# Patient Record
Sex: Male | Born: 1965 | Race: White | Hispanic: No | Smoking: Never smoker
Health system: Southern US, Community
[De-identification: ages and names within clinical notes are randomized; demographics above are authoritative.]

## PROBLEM LIST (undated history)

## (undated) DIAGNOSIS — H409 Unspecified glaucoma: Secondary | ICD-10-CM

## (undated) DIAGNOSIS — N200 Calculus of kidney: Secondary | ICD-10-CM

## (undated) HISTORY — PX: EYE SURGERY: SHX253

---

## 2010-07-19 ENCOUNTER — Ambulatory Visit: Payer: Self-pay | Admitting: Diagnostic Radiology

## 2010-07-19 ENCOUNTER — Emergency Department (HOSPITAL_BASED_OUTPATIENT_CLINIC_OR_DEPARTMENT_OTHER): Admission: EM | Admit: 2010-07-19 | Discharge: 2010-07-19 | Payer: Self-pay | Admitting: Emergency Medicine

## 2010-11-18 LAB — COMPREHENSIVE METABOLIC PANEL
Alkaline Phosphatase: 66 U/L (ref 39–117)
BUN: 18 mg/dL (ref 6–23)
Chloride: 107 mEq/L (ref 96–112)
Creatinine, Ser: 1 mg/dL (ref 0.4–1.5)
Glucose, Bld: 135 mg/dL — ABNORMAL HIGH (ref 70–99)
Potassium: 4.4 mEq/L (ref 3.5–5.1)
Total Bilirubin: 0.9 mg/dL (ref 0.3–1.2)

## 2010-11-18 LAB — URINALYSIS, ROUTINE W REFLEX MICROSCOPIC
Leukocytes, UA: NEGATIVE
Nitrite: NEGATIVE
Protein, ur: NEGATIVE mg/dL
Urobilinogen, UA: 0.2 mg/dL (ref 0.0–1.0)

## 2010-11-18 LAB — CBC
HCT: 41.8 % (ref 39.0–52.0)
MCH: 33.5 pg (ref 26.0–34.0)
MCV: 95.4 fL (ref 78.0–100.0)
RBC: 4.39 MIL/uL (ref 4.22–5.81)
RDW: 11.7 % (ref 11.5–15.5)
WBC: 11.1 10*3/uL — ABNORMAL HIGH (ref 4.0–10.5)

## 2010-11-18 LAB — URINE MICROSCOPIC-ADD ON

## 2010-11-18 LAB — DIFFERENTIAL
Basophils Absolute: 0.1 10*3/uL (ref 0.0–0.1)
Basophils Relative: 1 % (ref 0–1)
Lymphocytes Relative: 11 % — ABNORMAL LOW (ref 12–46)
Neutro Abs: 9.2 10*3/uL — ABNORMAL HIGH (ref 1.7–7.7)
Neutrophils Relative %: 83 % — ABNORMAL HIGH (ref 43–77)

## 2010-11-18 LAB — LIPASE, BLOOD: Lipase: 103 U/L (ref 23–300)

## 2011-06-13 IMAGING — CT CT ABD-PELV W/O CM
2 of 4 series · 16 of 46 positions shown, 18 images · non-contrast
Comparison: None.

CLINICAL DATA: Right flank pain radiating to right inguinal area.

CT ABDOMEN AND PELVIS WITHOUT CONTRAST
TECHNIQUE: Multidetector CT imaging of the abdomen and pelvis was
performed following the standard protocol without intravenous
contrast.

[Series 2: renal stone < 200 lbs 5.0 b31f · axial · 0.75mm/px · z∈[+661,+1066]mm · 13 of 89 slices shown, 15 images]
[im 4/89  soft-tissue]
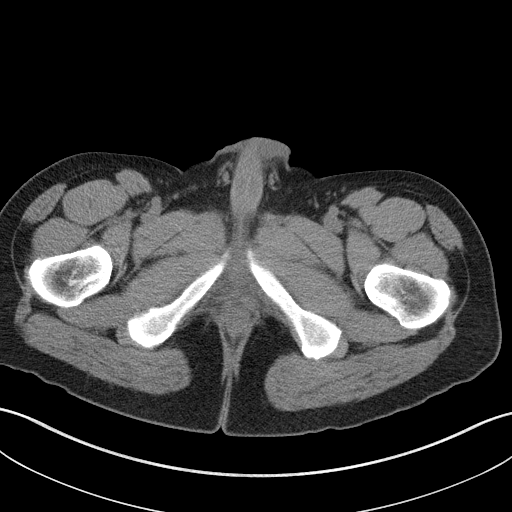
[im 4/89  bone]
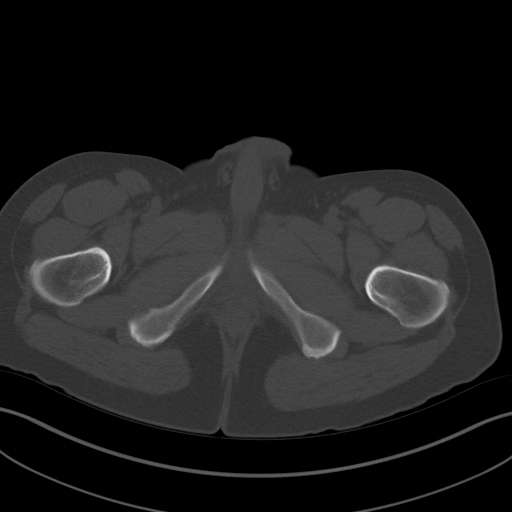
[im 11/89  soft-tissue]
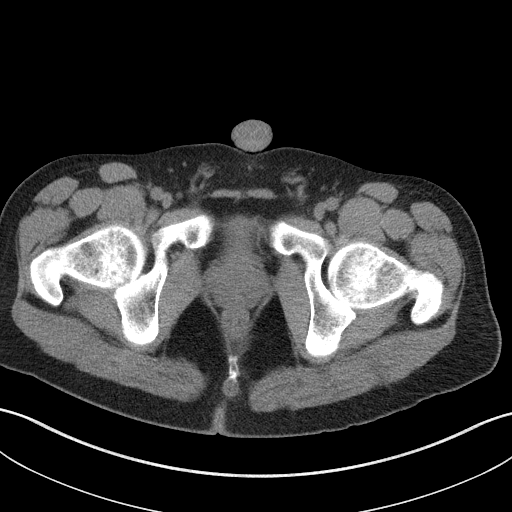
[im 18/89  soft-tissue]
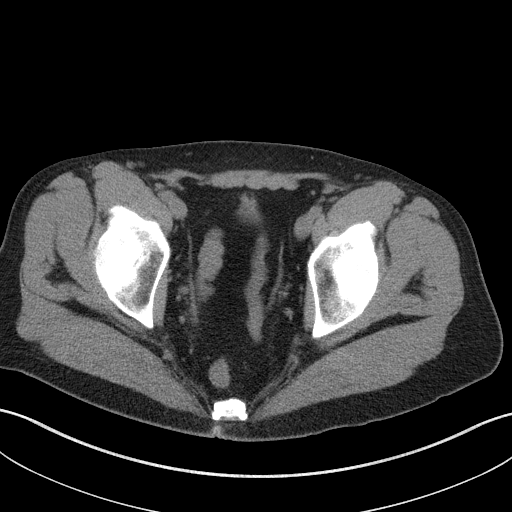
[im 25/89  soft-tissue]
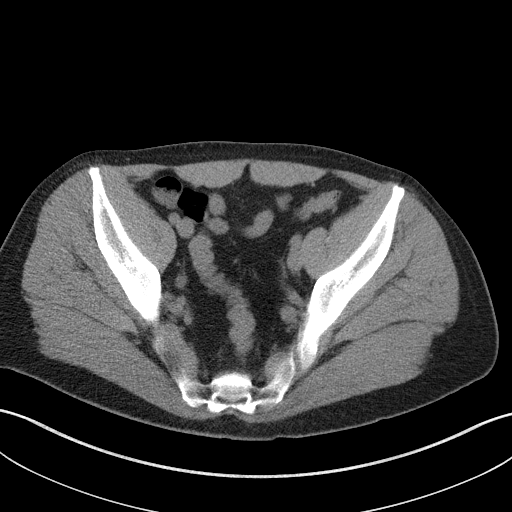
[im 32/89  soft-tissue]
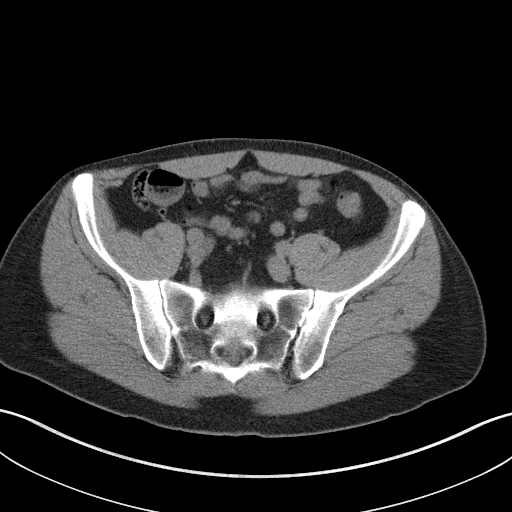
[im 39/89  soft-tissue]
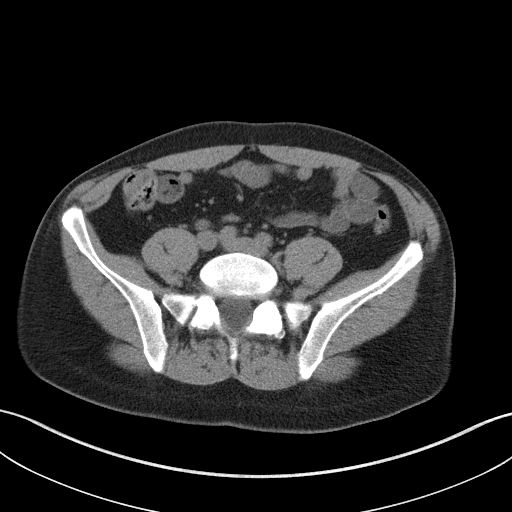
[im 46/89  soft-tissue]
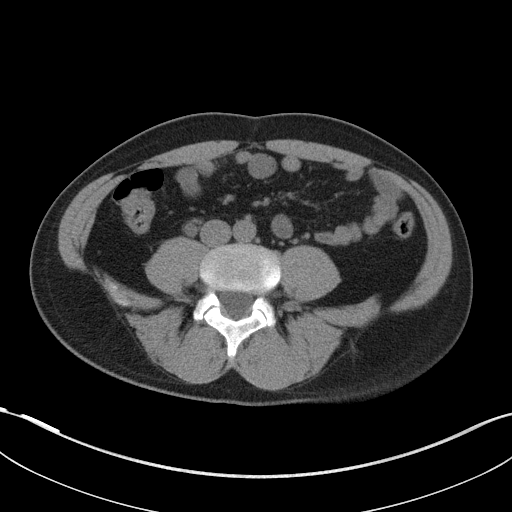
[im 50/89  soft-tissue]
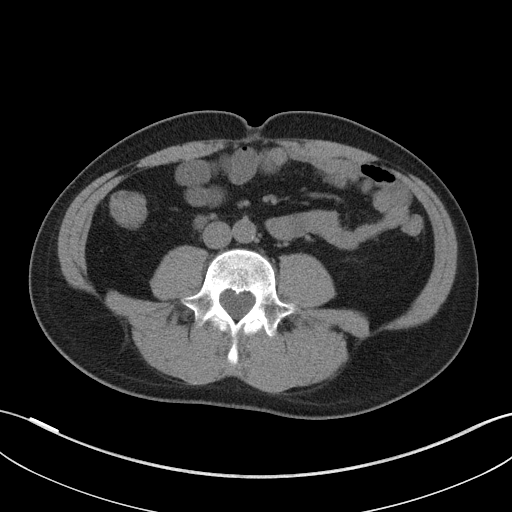
[im 57/89  soft-tissue]
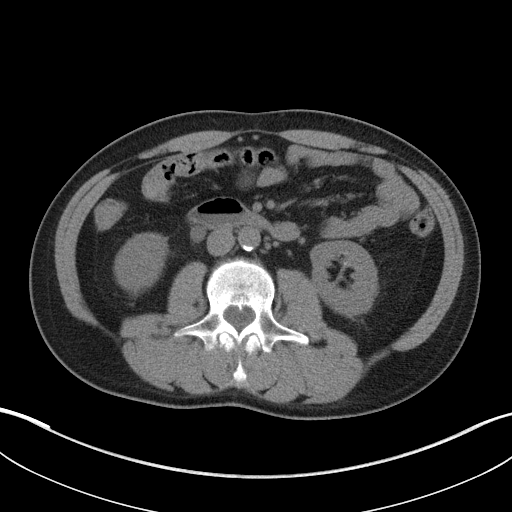
[im 57/89  bone]
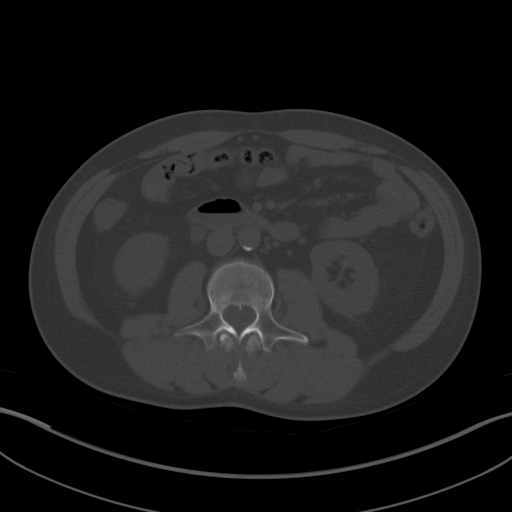
[im 64/89  soft-tissue]
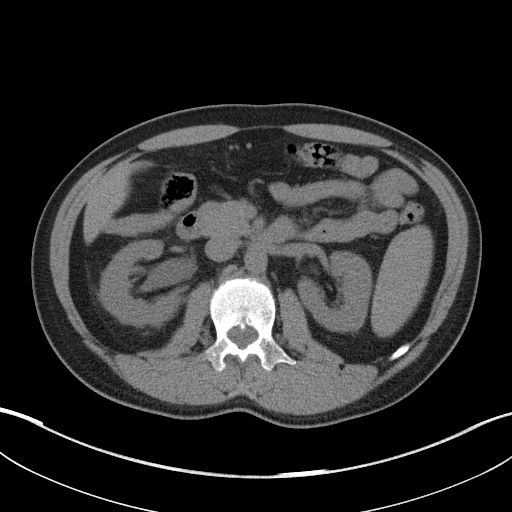
[im 71/89  soft-tissue]
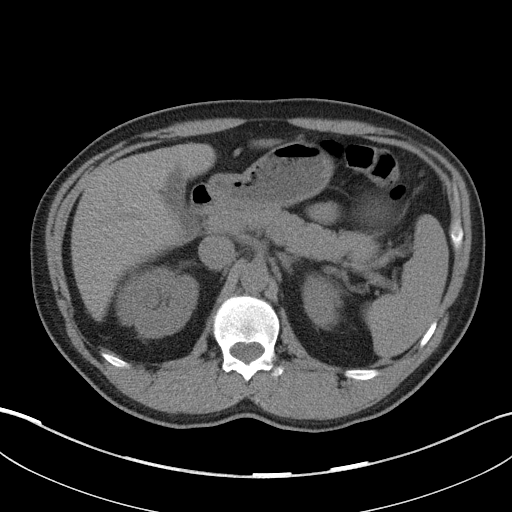
[im 78/89  soft-tissue]
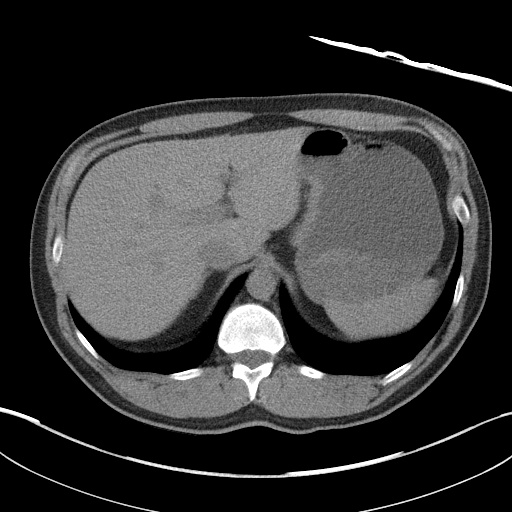
[im 85/89  soft-tissue]
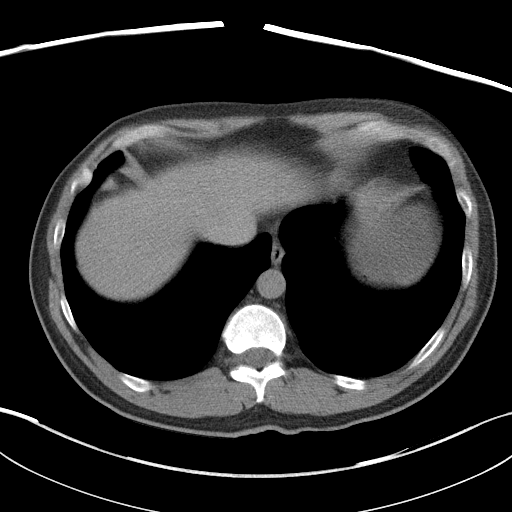

[Series 5: renal stone 3.0 coronal · coronal · 0.86mm/px · 3 of 73 slices shown]
[im 25/73  soft-tissue]
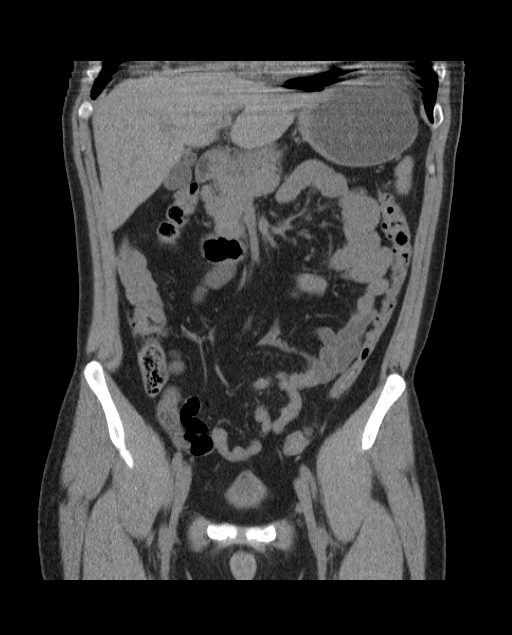
[im 33/73  soft-tissue]
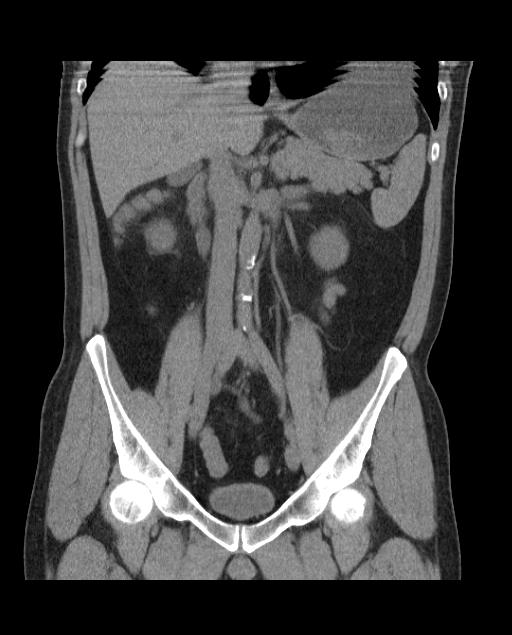
[im 41/73  soft-tissue]
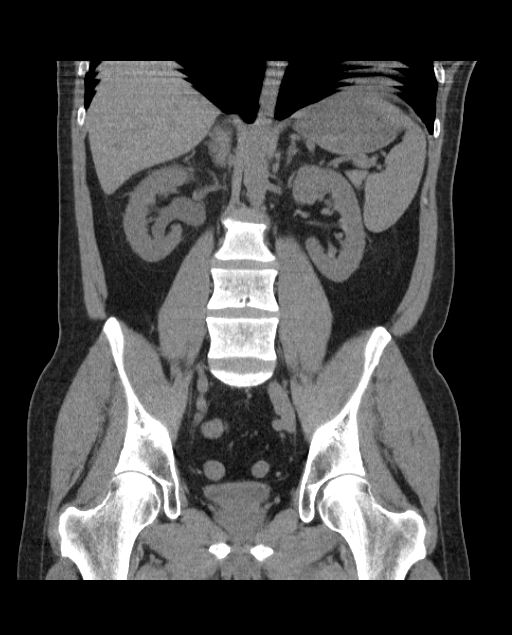

[16 of 46 positions shown; findings below may reference images not displayed]

FINDINGS: 2 mm nonobstructing stones are seen in the left kidney,
one in the upper and one in the lower pole.  No hydronephrosis is
present on the left and the ureter is normal along its course.  A 3
mm nonobstructing stone is seen in the lower pole of the right
kidney.  No other renal calculi are seen.  There is mild to
moderate hydronephrosis involving the right ureter with a 5 mm
obstructing right UVJ stone.  The urinary bladder is decompressed.

The lung bases are clear.  The heart is normal in size.  The
unenhanced appearance of the liver, spleen, pancreas, adrenals and
gallbladder is unremarkable.  There is no  small bowel obstruction.
A normal appendix is seen in the right lower quadrant.  The colon
is decompressed without inflammatory changes.  The prostate is
unremarkable.  No free fluid is seen in the abdomen or pelvis.
There is no lymphadenopathy.  Scattered atherosclerotic
calcifications are seen in the aorta.  Mild degenerative changes
are seen in the spine.  There are no aggressive lytic or blastic
bone lesions seen.
IMPRESSION: Five mm obstructing right UVJ stone.  Other subcentimeter renal
calculi are seen and detailed above.

## 2018-10-05 ENCOUNTER — Emergency Department (HOSPITAL_BASED_OUTPATIENT_CLINIC_OR_DEPARTMENT_OTHER): Payer: BLUE CROSS/BLUE SHIELD

## 2018-10-05 ENCOUNTER — Other Ambulatory Visit: Payer: Self-pay

## 2018-10-05 ENCOUNTER — Emergency Department (HOSPITAL_BASED_OUTPATIENT_CLINIC_OR_DEPARTMENT_OTHER)
Admission: EM | Admit: 2018-10-05 | Discharge: 2018-10-05 | Disposition: A | Discharge status: Home / Self Care | Payer: BLUE CROSS/BLUE SHIELD | Attending: Emergency Medicine | Admitting: Emergency Medicine

## 2018-10-05 ENCOUNTER — Encounter (HOSPITAL_BASED_OUTPATIENT_CLINIC_OR_DEPARTMENT_OTHER): Payer: Self-pay

## 2018-10-05 DIAGNOSIS — R109 Unspecified abdominal pain: Secondary | ICD-10-CM | POA: Diagnosis present

## 2018-10-05 DIAGNOSIS — R112 Nausea with vomiting, unspecified: Secondary | ICD-10-CM | POA: Diagnosis not present

## 2018-10-05 DIAGNOSIS — R197 Diarrhea, unspecified: Secondary | ICD-10-CM | POA: Insufficient documentation

## 2018-10-05 DIAGNOSIS — Z79899 Other long term (current) drug therapy: Secondary | ICD-10-CM | POA: Insufficient documentation

## 2018-10-05 DIAGNOSIS — M549 Dorsalgia, unspecified: Secondary | ICD-10-CM | POA: Diagnosis not present

## 2018-10-05 HISTORY — DX: Unspecified glaucoma: H40.9

## 2018-10-05 HISTORY — DX: Calculus of kidney: N20.0

## 2018-10-05 LAB — URINALYSIS, ROUTINE W REFLEX MICROSCOPIC
Glucose, UA: NEGATIVE mg/dL
Hgb urine dipstick: NEGATIVE
KETONES UR: 15 mg/dL — AB
Leukocytes, UA: NEGATIVE
Nitrite: NEGATIVE
PROTEIN: NEGATIVE mg/dL
Specific Gravity, Urine: 1.015 (ref 1.005–1.030)
pH: 5.5 (ref 5.0–8.0)

## 2018-10-05 LAB — COMPREHENSIVE METABOLIC PANEL
ALT: 23 U/L (ref 0–44)
AST: 25 U/L (ref 15–41)
Albumin: 5.2 g/dL — ABNORMAL HIGH (ref 3.5–5.0)
Alkaline Phosphatase: 77 U/L (ref 38–126)
Anion gap: 15 (ref 5–15)
BUN: 26 mg/dL — AB (ref 6–20)
CO2: 21 mmol/L — ABNORMAL LOW (ref 22–32)
Calcium: 9.6 mg/dL (ref 8.9–10.3)
Chloride: 101 mmol/L (ref 98–111)
Creatinine, Ser: 1.35 mg/dL — ABNORMAL HIGH (ref 0.61–1.24)
GFR calc Af Amer: >60 mL/min (ref >60)
GFR calc non Af Amer: 60 mL/min — ABNORMAL LOW (ref >60)
GLUCOSE: 132 mg/dL — AB (ref 70–99)
POTASSIUM: 4.6 mmol/L (ref 3.5–5.1)
Sodium: 137 mmol/L (ref 135–145)
Total Bilirubin: 2.6 mg/dL — ABNORMAL HIGH (ref 0.3–1.2)
Total Protein: 8.5 g/dL — ABNORMAL HIGH (ref 6.5–8.1)

## 2018-10-05 LAB — CBC
HEMATOCRIT: 49.6 % (ref 39.0–52.0)
HEMOGLOBIN: 17.7 g/dL — AB (ref 13.0–17.0)
MCH: 33.5 pg (ref 26.0–34.0)
MCHC: 35.7 g/dL (ref 30.0–36.0)
MCV: 93.8 fL (ref 80.0–100.0)
Platelets: 258 10*3/uL (ref 150–400)
RBC: 5.29 MIL/uL (ref 4.22–5.81)
RDW: 12.1 % (ref 11.5–15.5)
WBC: 15.5 10*3/uL — ABNORMAL HIGH (ref 4.0–10.5)
nRBC: 0 % (ref 0.0–0.2)

## 2018-10-05 LAB — LIPASE, BLOOD: LIPASE: 29 U/L (ref 11–51)

## 2018-10-05 MED ORDER — SODIUM CHLORIDE 0.9 % IV BOLUS
1000.0000 mL | Freq: Once | INTRAVENOUS | Status: AC
  Administered 2018-10-05: 1000 mL via INTRAVENOUS

## 2018-10-05 MED ORDER — FENTANYL CITRATE (PF) 100 MCG/2ML IJ SOLN
50.0000 ug | Freq: Once | INTRAMUSCULAR | Status: AC
  Administered 2018-10-05: 50 ug via INTRAVENOUS
  Filled 2018-10-05: qty 2

## 2018-10-05 MED ORDER — DICYCLOMINE HCL 20 MG PO TABS: 20.0000 mg | ORAL_TABLET | Freq: Two times a day (BID) | ORAL | 0 refills | Status: AC

## 2018-10-05 MED ORDER — ONDANSETRON 4 MG PO TBDP: 4.0000 mg | ORAL_TABLET | Freq: Three times a day (TID) | ORAL | 0 refills | Status: AC | PRN

## 2018-10-05 MED ORDER — FAMOTIDINE 20 MG PO TABS: 20.0000 mg | ORAL_TABLET | Freq: Two times a day (BID) | ORAL | 0 refills | Status: AC

## 2018-10-05 MED ORDER — FAMOTIDINE IN NACL 20-0.9 MG/50ML-% IV SOLN
20.0000 mg | Freq: Once | INTRAVENOUS | Status: AC
  Administered 2018-10-05: 20 mg via INTRAVENOUS
  Filled 2018-10-05: qty 50

## 2018-10-05 MED ORDER — FENTANYL CITRATE (PF) 100 MCG/2ML IJ SOLN
25.0000 ug | Freq: Once | INTRAMUSCULAR | Status: AC
  Administered 2018-10-05: 25 ug via INTRAVENOUS
  Filled 2018-10-05: qty 2

## 2018-10-05 MED ORDER — ONDANSETRON HCL 4 MG/2ML IJ SOLN
4.0000 mg | Freq: Once | INTRAMUSCULAR | Status: AC | PRN
  Administered 2018-10-05: 4 mg via INTRAVENOUS
  Filled 2018-10-05: qty 2

## 2018-10-05 MED FILL — DICYCLOMINE 20 MG TABLET: 20 | 10 days supply | Qty: 20 | Fill #0

## 2018-10-05 MED FILL — ONDANSETRON ODT 4 MG TABLET: 4 | 1 days supply | Qty: 5 | Fill #0

## 2018-10-05 MED FILL — FAMOTIDINE 20 MG TABLET: 20 | 15 days supply | Qty: 30 | Fill #0

## 2018-10-05 NOTE — ED Triage Notes (Signed)
C/o abd pain, n/v/d started 3am-to triage in w/c-pale

## 2018-10-05 NOTE — ED Notes (Signed)
Patient transported to CT 

## 2018-10-05 NOTE — ED Notes (Signed)
Pt tolerated po fluids. Mild nausea but no emesis

## 2018-10-05 NOTE — ED Notes (Signed)
PO fluids given. Pt denies nausea. 

## 2018-10-05 NOTE — ED Provider Notes (Signed)
[facility] EMERGENCY DEPARTMENT Provider Note   CSN: [id number] Arrival date & time: 10/05/18  1126     History   Chief Complaint Chief Complaint  Patient presents with  . Abdominal Pain    HPI Brady Nichols is a 53 y.o. male with a past medical history of kidney stones x10 in the past presents to ED for 9-hour history of acute onset right sided flank pain, back pain.  He has had 3 episodes of nonbloody, nonbilious emesis and 3 episodes of diarrhea since this morning as well.  He notes that his daughter had similar "stomach bug" symptoms over the weekend and he was concerned that he may have this.  However, when he began having the pain he states that it felt similar to his prior kidney stones.  States that he has passed his prior kidney stones with the exception of one time, and it appears that he had a stent that time.  Most recent one was 2 years ago.  He has not been taking medications to help with his symptoms secondary to his vomiting.  Denies any fever, dysuria, recent travel, prior abdominal surgeries.  HPI  Past Medical History:  Diagnosis Date  . Glaucoma   . Kidney stone     There are no active problems to display for this patient.   Past Surgical History:  Procedure Laterality Date  . EYE SURGERY          Home Medications    Prior to Admission medications   Medication Sig Start Date End Date Taking? Authorizing Provider  atorvastatin (LIPITOR) 10 MG tablet Take 10 mg by mouth daily. Pt unsure of dose   Yes [provider]  brimonidine (ALPHAGAN) 0.2 % ophthalmic solution Apply to eye. 08/17/18 08/17/19 Yes [provider]  dorzolamide-timolol (COSOPT) 22.3-6.8 MG/ML ophthalmic solution INSTILL 1 DROP INTO RIGHT EYE TWICE A DAY 10/03/18  Yes [provider]  LISINOPRIL PO Take by mouth daily.   Yes [provider]  prednisoLONE acetate (PRED FORTE) 1 % ophthalmic suspension 1 drop 2 times a day in the right eye  for 7 days ONLY 06/02/18  Yes [provider]  propranolol (INDERAL) 20 MG tablet TAKE 1 TABLET DAILY AS NEEDED 12/23/16  Yes [provider]  venlafaxine (EFFEXOR) 50 MG tablet Take 50 mg by mouth 2 (two) times daily.   Yes [provider]  zolpidem (AMBIEN CR) 6.25 MG CR tablet TAKE 1 TABLET BY MOUTH EVERY DAY 08/18/18  Yes [provider]  dicyclomine (BENTYL) 20 MG tablet Take 1 tablet (20 mg total) by mouth 2 (two) times daily. 10/05/18   Ninoshka Wainwright, PA-C  famotidine (PEPCID) 20 MG tablet Take 1 tablet (20 mg total) by mouth 2 (two) times daily. 10/05/18   Savannah Morford, PA-C  latanoprost (XALATAN) 0.005 % ophthalmic solution INSTILL 1 DROP INTO BOTH EYES EVERY DAY AT NIGHT 09/26/18   [provider]  ondansetron (ZOFRAN ODT) 4 MG disintegrating tablet Take 1 tablet (4 mg total) by mouth every 8 (eight) hours as needed for nausea or vomiting. 10/05/18   Erendira Crabtree, PA-C  RHOPRESSA 0.02 % SOLN PLACE 1 DROP INTO THE RIGHT EYE NIGHTLY 09/13/18   [provider]    Family History No family history on file.  Social History Social History   Tobacco Use  . Smoking status: Never Smoker  . Smokeless tobacco: Never Used  Substance Use Topics  . Alcohol use: Yes    Comment: daily  .  Drug use: Never     Allergies   Contrast media [iodinated diagnostic agents]   Review of Systems Review of Systems  Constitutional: Negative for appetite change, chills and fever.  HENT: Negative for ear pain, rhinorrhea, sneezing and sore throat.   Eyes: Negative for photophobia and visual disturbance.  Respiratory: Negative for cough, chest tightness, shortness of breath and wheezing.   Cardiovascular: Negative for chest pain and palpitations.  Gastrointestinal: Positive for abdominal pain, diarrhea, nausea and vomiting. Negative for blood in stool and constipation.  Genitourinary: Positive for flank pain. Negative for dysuria, hematuria and urgency.    Musculoskeletal: Negative for myalgias.  Skin: Negative for rash.  Neurological: Negative for dizziness, weakness and light-headedness.     Physical Exam Updated Vital Signs BP 117/74   Pulse 84   Temp 99.2 F (37.3 C) (Oral)   Resp 16   Ht 6' (1.829 m)   Wt 83.9 kg   SpO2 100%   BMI 25.09 kg/m   Physical Exam Vitals signs and nursing note reviewed.  Constitutional:      General: He is not in acute distress.    Appearance: He is well-developed.     Comments: Appears uncomfortable.  HENT:     Head: Normocephalic and atraumatic.     Nose: Nose normal.  Eyes:     General: No scleral icterus.       Left eye: No discharge.     Conjunctiva/sclera: Conjunctivae normal.  Neck:     Musculoskeletal: Normal range of motion and neck supple.  Cardiovascular:     Rate and Rhythm: Regular rhythm. Tachycardia present.     Heart sounds: Normal heart sounds. No murmur. No friction rub. No gallop.   Pulmonary:     Effort: Pulmonary effort is normal. No respiratory distress.     Breath sounds: Normal breath sounds.  Abdominal:     General: Bowel sounds are normal. There is no distension.     Palpations: Abdomen is soft.     Tenderness: There is no abdominal tenderness. There is right CVA tenderness (And right flank). There is no guarding.  Musculoskeletal: Normal range of motion.  Skin:    General: Skin is warm and dry.     Findings: No rash.  Neurological:     Mental Status: He is alert.     Motor: No abnormal muscle tone.     Coordination: Coordination normal.      ED Treatments / Results  Labs (all labs ordered are listed, but only abnormal results are displayed) Labs Reviewed  COMPREHENSIVE METABOLIC PANEL - Abnormal; Notable for the following components:      Result Value   CO2 21 (*)    Glucose, Bld 132 (*)    BUN 26 (*)    Creatinine, Ser 1.35 (*)    Total Protein 8.5 (*)    Albumin 5.2 (*)    Total Bilirubin 2.6 (*)    GFR calc non Af Amer 60 (*)    All  other components within normal limits  CBC - Abnormal; Notable for the following components:   WBC 15.5 (*)    Hemoglobin 17.7 (*)    All other components within normal limits  URINALYSIS, ROUTINE W REFLEX MICROSCOPIC - Abnormal; Notable for the following components:   Color, Urine ORANGE (*)    Bilirubin Urine SMALL (*)    Ketones, ur 15 (*)    All other components within normal limits  URINE CULTURE  LIPASE, BLOOD  EKG None  Radiology Ct Renal Stone Study  Result Date: 10/05/2018 CLINICAL DATA:  Vomiting since this morning. RIGHT abdominal pain. History kidney stones. EXAM: CT ABDOMEN AND PELVIS WITHOUT CONTRAST TECHNIQUE: Multidetector CT imaging of the abdomen and pelvis was performed following the standard protocol without IV contrast. COMPARISON:  CT abdomen and pelvis July 19, 2010. FINDINGS: LOWER CHEST: Lung bases are clear. The visualized heart size is normal. Partially imaged coronary artery calcifications. No pericardial effusion. HEPATOBILIARY: Normal. PANCREAS: Normal. SPLEEN: Normal. ADRENALS/URINARY TRACT: Kidneys are orthotopic, demonstrating normal size and morphology. 3 mm LEFT lower pole nephrolithiasis. No hydronephrosis; limited assessment for renal masses by nonenhanced CT. The unopacified ureters are normal in course and caliber. Urinary bladder is partially distended and unremarkable. Normal adrenal glands. STOMACH/BOWEL: The stomach, small and large bowel are normal in course and caliber without inflammatory changes, sensitivity decreased by lack of enteric contrast. Normal appendix. VASCULAR/LYMPHATIC: Aortoiliac vessels are normal in course and caliber. Mild calcific atherosclerosis. No lymphadenopathy by CT size criteria. REPRODUCTIVE: Mild prostatomegaly. OTHER: No intraperitoneal free fluid or free air. MUSCULOSKELETAL: Non-acute. Moderate thoracolumbar junction spondylosis. Tiny fat containing umbilical hernia. IMPRESSION: 1. 3 mm nonobstructing LEFT  nephrolithiasis. 2. No acute intra-abdominal/pelvic process.  Normal appendix. 3.  Aortic Atherosclerosis (ICD10-I70.0). Electronically Signed   By: Awilda Metroourtnay  Bloomer M.D.   On: 10/05/2018 13:40    Procedures Procedures (including critical care time)  Medications Ordered in ED Medications  ondansetron (ZOFRAN) injection 4 mg (4 mg Intravenous Given 10/05/18 1242)  sodium chloride 0.9 % bolus 1,000 mL ( Intravenous Stopped 10/05/18 1343)  fentaNYL (SUBLIMAZE) injection 50 mcg (50 mcg Intravenous Given 10/05/18 1245)  sodium chloride 0.9 % bolus 1,000 mL ( Intravenous Stopped 10/05/18 1533)  fentaNYL (SUBLIMAZE) injection 25 mcg (25 mcg Intravenous Given 10/05/18 1455)  famotidine (PEPCID) IVPB 20 mg premix ( Intravenous Stopped 10/05/18 1549)     Initial Impression / Assessment and Plan / ED Course  I have reviewed the triage vital signs and the nursing notes.  Pertinent labs & imaging results that were available during my care of the patient were reviewed by me and considered in my medical decision making (see chart for details).     53 year old male with past medical history of kidney stones presents to ED for 9-hour history of acute onset of right flank pain, right back pain.  He reports associated nonbloody, nonbilious emesis and diarrhea.  States that his daughter had similar GI symptoms and he was concerned that he may have this.  However, due to the pain he is concerned that he has another kidney stone.  Most recent kidney stone was 2 years ago.  On my exam he is tenderness palpation of the right flank and right CVA.  No right upper quadrant or left-sided tenderness to palpation.  Abdomen is nondistended, no rebound or guarding.  Vital signs are within normal limits.  Lab work significant for leukocytosis of 15.5, hemoconcentration of hemoglobin of 17.7.  CMP significant for creatinine 1.35, total bilirubin of 2.6.  Anion gap of 15.  Urinalysis shows small bilirubin and urine as well.  Lipase  unremarkable.  CT renal stone study shows 3 mm nonobstructing stone of the left kidney.  Otherwise unremarkable.  Suspect that symptoms are viral in nature.  He has no right upper quadrant tenderness palpation so unsure of relevance of bilirubin elevation.  He was given 2 L of fluids, pain medication and antiemetics here with improvement in his symptoms.  He is able to  tolerate p.o. intake without difficulty.  I did advise patient that he needed his bilirubin and creatinine rechecked in 2 days for improvement.  In the meantime we will give as needed Zofran, Pepcid and Bentyl to help with symptoms.  Stressed the importance of hydration to prevent worsening of lab values or symptoms.  Patient is agreeable to this plan.  He appears comfortable here compared to initial presentation.  Will advise him to return to ED for any severe worsening symptoms. Patient discussed with my attending, Dr. Rush Landmarkegeler.  Patient is hemodynamically stable, in NAD, and able to ambulate in the ED. Evaluation does not show pathology that would require ongoing emergent intervention or inpatient treatment. I explained the diagnosis to the patient. Pain has been managed and has no complaints prior to discharge. Patient is comfortable with above plan and is stable for discharge at this time. All questions were answered prior to disposition. Strict return precautions for returning to the ED were discussed. Encouraged follow up with PCP.    Portions of this note were generated with Scientist, clinical (histocompatibility and immunogenetics)Dragon dictation software. Dictation errors may occur despite best attempts at proofreading.   Final Clinical Impressions(s) / ED Diagnoses   Final diagnoses:  Nausea vomiting and diarrhea    ED Discharge Orders         Ordered    ondansetron (ZOFRAN ODT) 4 MG disintegrating tablet  Every 8 hours PRN     10/05/18 1608    famotidine (PEPCID) 20 MG tablet  2 times daily     10/05/18 1608    dicyclomine (BENTYL) 20 MG tablet  2 times daily     10/05/18  1608           Dietrich PatesKhatri, Cannon Arreola, PA-C 10/05/18 1611    Tegeler, Canary Brimhristopher J, MD 10/05/18 2041

## 2018-10-05 NOTE — ED Notes (Signed)
ED Provider at bedside. 

## 2018-10-05 NOTE — Discharge Instructions (Signed)
Take the following medications as needed to help with your symptoms. Your CT scan showed that you had a 3 mm stone in your left kidney.  This is not obstructing so this is not the cause of your pain. Return to the ED if you start to develop a fever, worsening abdominal pain, vomiting or coughing up blood, blood in your stool, lightheadedness. Please follow-up with your primary care provider to have your bilirubin and your creatinine rechecked in 2 days for improvement.

## 2018-10-05 NOTE — ED Notes (Signed)
PT states still unable to void.

## 2018-10-06 LAB — URINE CULTURE: Culture: NO GROWTH

## 2019-08-30 IMAGING — CT CT RENAL STONE PROTOCOL
2 of 4 series · 16 of 46 positions shown, 18 images · non-contrast
Comparison: CT abdomen and pelvis July 19, 2010.

CLINICAL DATA: Vomiting since this morning. RIGHT abdominal pain.
History kidney stones.

EXAM:
CT ABDOMEN AND PELVIS WITHOUT CONTRAST
TECHNIQUE: Multidetector CT imaging of the abdomen and pelvis was performed
following the standard protocol without IV contrast.

[Series 2: axial st · axial · 0.89mm/px · z∈[-560,-70]mm · 13 of 108 slices shown, 15 images]
[im 5/108  soft-tissue]
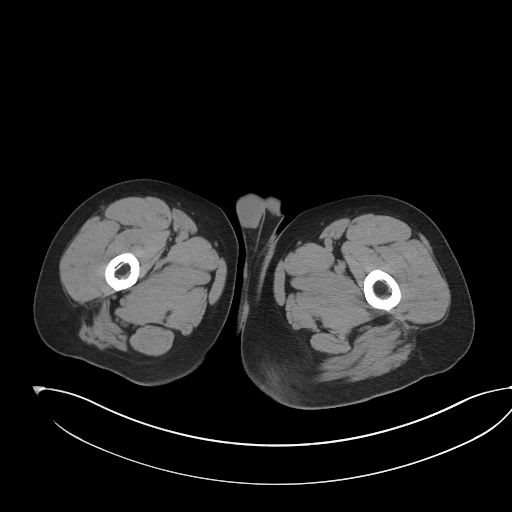
[im 5/108  bone]
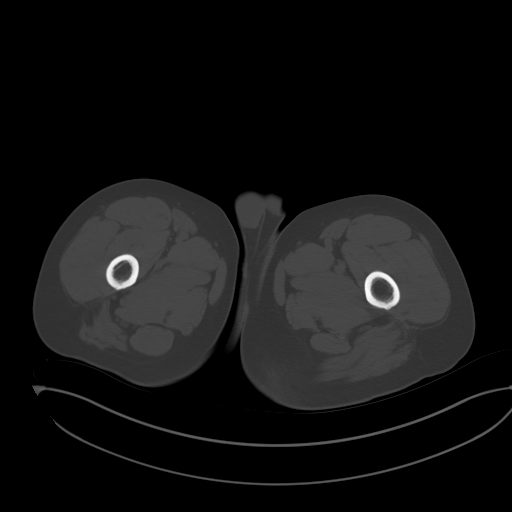
[im 13/108  soft-tissue]
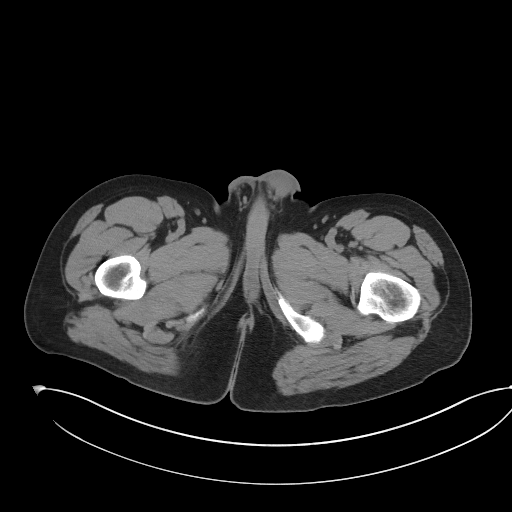
[im 22/108  soft-tissue]
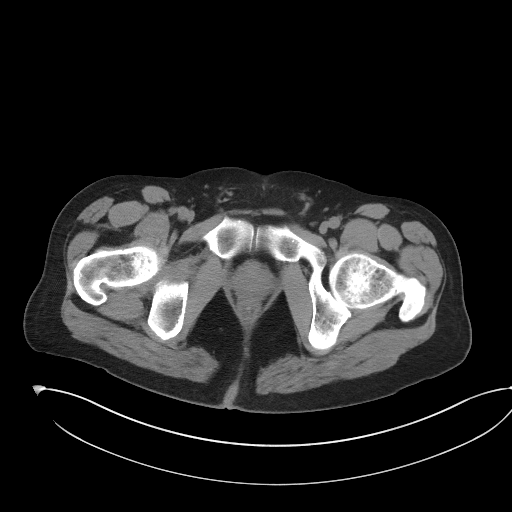
[im 30/108  soft-tissue]
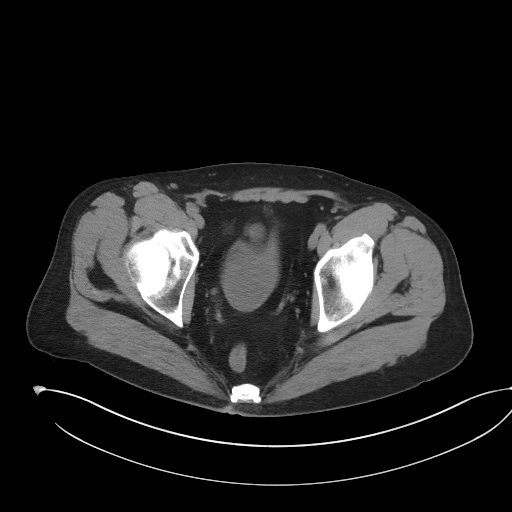
[im 39/108  soft-tissue]
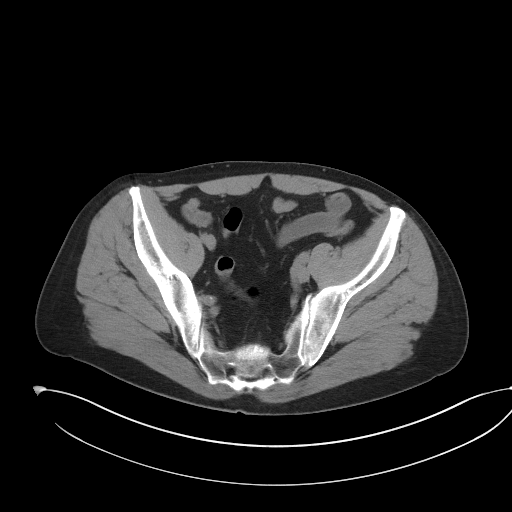
[im 48/108  soft-tissue]
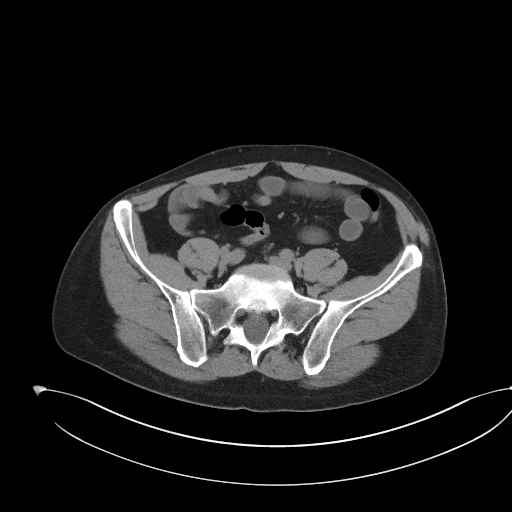
[im 56/108  soft-tissue]
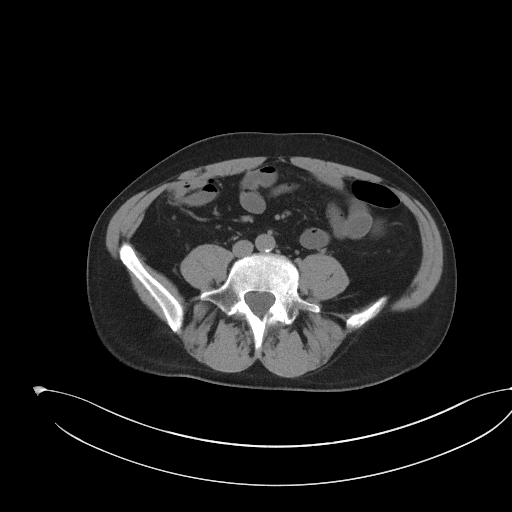
[im 60/108  soft-tissue]
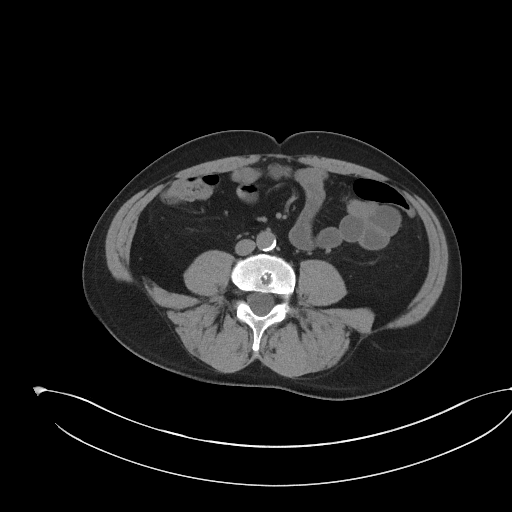
[im 69/108  soft-tissue]
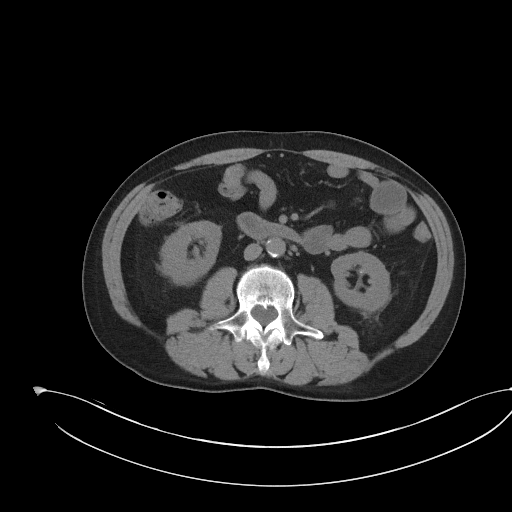
[im 69/108  bone]
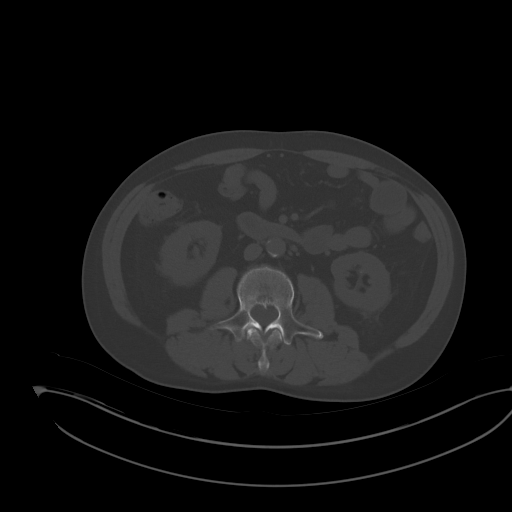
[im 78/108  soft-tissue]
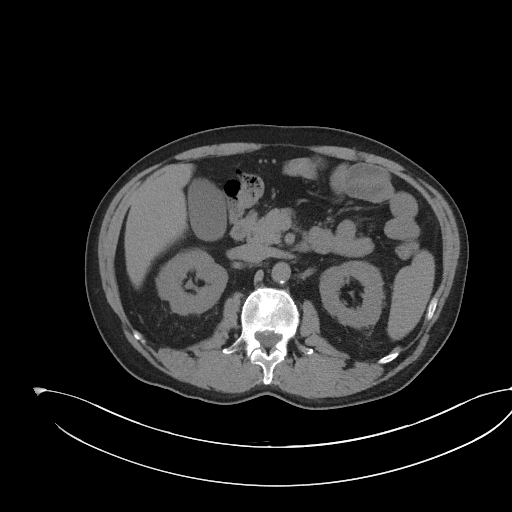
[im 86/108  soft-tissue]
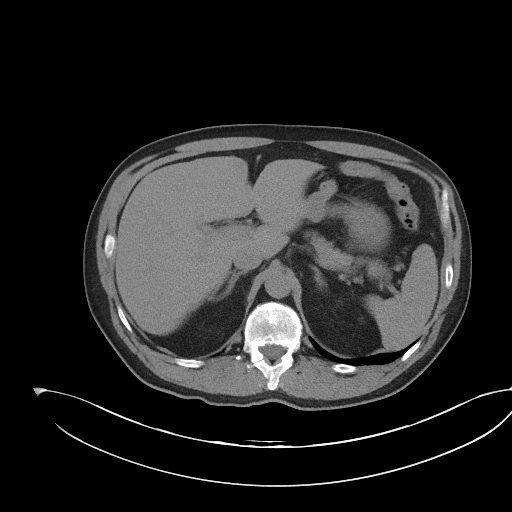
[im 95/108  soft-tissue]
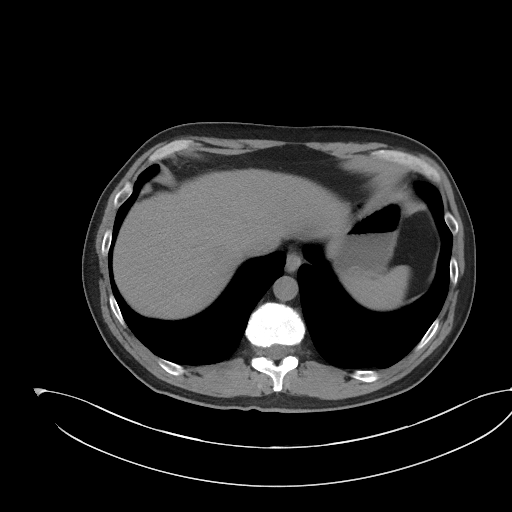
[im 103/108  soft-tissue]
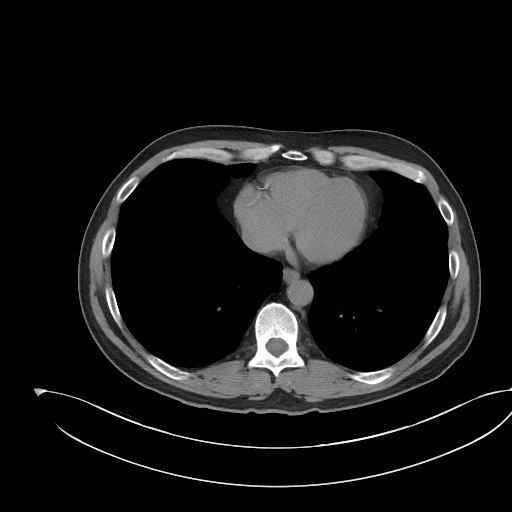

[Series 4: coronal st · coronal · 0.86mm/px · 3 of 87 slices shown]
[im 29/87  soft-tissue]
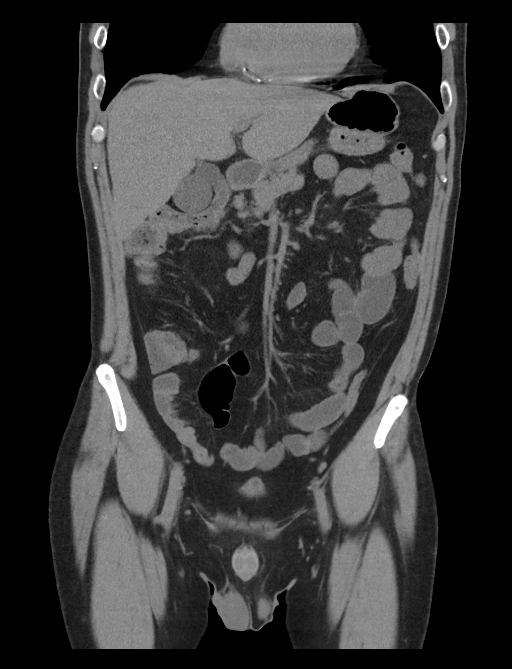
[im 39/87  soft-tissue]
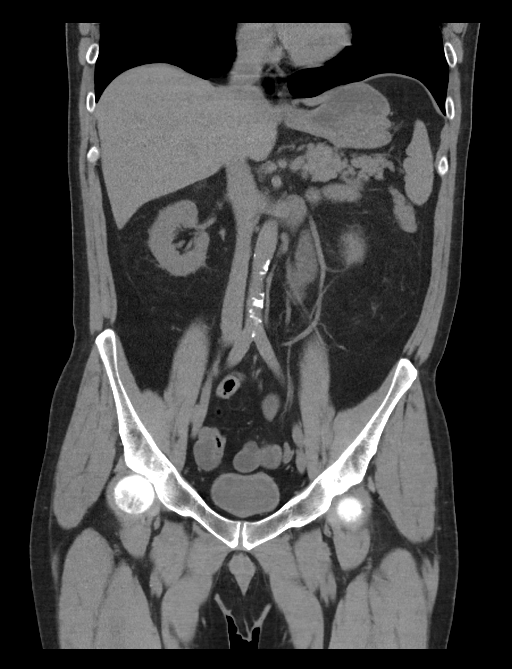
[im 48/87  soft-tissue]
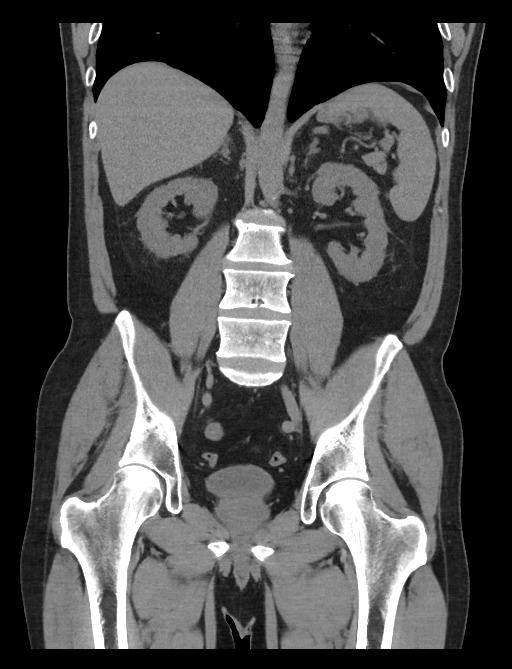

[16 of 46 positions shown; findings below may reference images not displayed]

FINDINGS: LOWER CHEST: Lung bases are clear. The visualized heart size is
normal. Partially imaged coronary artery calcifications. No
pericardial effusion.

HEPATOBILIARY: Normal.

PANCREAS: Normal.

SPLEEN: Normal.

ADRENALS/URINARY TRACT: Kidneys are orthotopic, demonstrating normal
size and morphology. 3 mm LEFT lower pole nephrolithiasis. No
hydronephrosis; limited assessment for renal masses by nonenhanced
CT. The unopacified ureters are normal in course and caliber.
Urinary bladder is partially distended and unremarkable. Normal
adrenal glands.

STOMACH/BOWEL: The stomach, small and large bowel are normal in
course and caliber without inflammatory changes, sensitivity
decreased by lack of enteric contrast. Normal appendix.

VASCULAR/LYMPHATIC: Aortoiliac vessels are normal in course and
caliber. Mild calcific atherosclerosis. No lymphadenopathy by CT
size criteria.

REPRODUCTIVE: Mild prostatomegaly.

OTHER: No intraperitoneal free fluid or free air.

MUSCULOSKELETAL: Non-acute. Moderate thoracolumbar junction
spondylosis. Tiny fat containing umbilical hernia.
IMPRESSION: 1. 3 mm nonobstructing LEFT nephrolithiasis.
2. No acute intra-abdominal/pelvic process.  Normal appendix.
3.  Aortic Atherosclerosis ([id number]).
